# Patient Record
Sex: Male | Born: 1950 | Race: White | Hispanic: No | Marital: Married
Health system: Southern US, Community
[De-identification: ages and names within clinical notes are randomized; demographics above are authoritative.]

## PROBLEM LIST (undated history)

## (undated) DIAGNOSIS — A4102 Sepsis due to Methicillin resistant Staphylococcus aureus: Secondary | ICD-10-CM

## (undated) DIAGNOSIS — G894 Chronic pain syndrome: Secondary | ICD-10-CM

## (undated) DIAGNOSIS — J9621 Acute and chronic respiratory failure with hypoxia: Secondary | ICD-10-CM

## (undated) DIAGNOSIS — Z930 Tracheostomy status: Secondary | ICD-10-CM

## (undated) DIAGNOSIS — I739 Peripheral vascular disease, unspecified: Secondary | ICD-10-CM

## (undated) DIAGNOSIS — L97909 Non-pressure chronic ulcer of unspecified part of unspecified lower leg with unspecified severity: Secondary | ICD-10-CM

---

## 2019-06-22 ENCOUNTER — Inpatient Hospital Stay
Admission: RE | Admit: 2019-06-22 | Discharge: 2019-08-04 | Disposition: A | Discharge status: Skilled Nursing Facility | Payer: Medicare PPO | Source: Other Acute Inpatient Hospital | Attending: Internal Medicine | Admitting: Internal Medicine

## 2019-06-22 ENCOUNTER — Other Ambulatory Visit (HOSPITAL_COMMUNITY): Payer: Medicare PPO

## 2019-06-22 DIAGNOSIS — A4102 Sepsis due to Methicillin resistant Staphylococcus aureus: Secondary | ICD-10-CM | POA: Diagnosis present

## 2019-06-22 DIAGNOSIS — Z931 Gastrostomy status: Secondary | ICD-10-CM

## 2019-06-22 DIAGNOSIS — I739 Peripheral vascular disease, unspecified: Secondary | ICD-10-CM | POA: Diagnosis present

## 2019-06-22 DIAGNOSIS — G894 Chronic pain syndrome: Secondary | ICD-10-CM | POA: Diagnosis present

## 2019-06-22 DIAGNOSIS — J9621 Acute and chronic respiratory failure with hypoxia: Secondary | ICD-10-CM | POA: Diagnosis present

## 2019-06-22 DIAGNOSIS — J189 Pneumonia, unspecified organism: Secondary | ICD-10-CM

## 2019-06-22 DIAGNOSIS — Z930 Tracheostomy status: Secondary | ICD-10-CM

## 2019-06-22 HISTORY — DX: Chronic pain syndrome: G89.4

## 2019-06-22 HISTORY — DX: Tracheostomy status: Z93.0

## 2019-06-22 HISTORY — DX: Sepsis due to methicillin resistant Staphylococcus aureus: A41.02

## 2019-06-22 HISTORY — DX: Peripheral vascular disease, unspecified: L97.909

## 2019-06-22 HISTORY — DX: Non-pressure chronic ulcer of unspecified part of unspecified lower leg with unspecified severity: I73.9

## 2019-06-22 HISTORY — DX: Acute and chronic respiratory failure with hypoxia: J96.21

## 2019-06-22 MED ORDER — IOHEXOL 300 MG/ML  SOLN
20.0000 mL | Freq: Once | INTRAMUSCULAR | Status: AC | PRN
  Administered 2019-06-22: 20 mL

## 2019-06-23 DIAGNOSIS — A4102 Sepsis due to Methicillin resistant Staphylococcus aureus: Secondary | ICD-10-CM

## 2019-06-23 DIAGNOSIS — I739 Peripheral vascular disease, unspecified: Secondary | ICD-10-CM

## 2019-06-23 DIAGNOSIS — J9621 Acute and chronic respiratory failure with hypoxia: Secondary | ICD-10-CM

## 2019-06-23 DIAGNOSIS — G894 Chronic pain syndrome: Secondary | ICD-10-CM | POA: Diagnosis not present

## 2019-06-23 DIAGNOSIS — L97909 Non-pressure chronic ulcer of unspecified part of unspecified lower leg with unspecified severity: Secondary | ICD-10-CM

## 2019-06-23 DIAGNOSIS — Z930 Tracheostomy status: Secondary | ICD-10-CM

## 2019-06-23 LAB — BASIC METABOLIC PANEL
Anion gap: 13 (ref 5–15)
BUN: 60 mg/dL — ABNORMAL HIGH (ref 8–23)
CO2: 21 mmol/L — ABNORMAL LOW (ref 22–32)
Calcium: 9.9 mg/dL (ref 8.9–10.3)
Chloride: 107 mmol/L (ref 98–111)
Creatinine, Ser: 1.39 mg/dL — ABNORMAL HIGH (ref 0.61–1.24)
GFR calc Af Amer: 60 mL/min — ABNORMAL LOW (ref >60)
GFR calc non Af Amer: 51 mL/min — ABNORMAL LOW (ref >60)
Glucose, Bld: 165 mg/dL — ABNORMAL HIGH (ref 70–99)
Potassium: 4.4 mmol/L (ref 3.5–5.1)
Sodium: 141 mmol/L (ref 135–145)

## 2019-06-23 LAB — CBC
HCT: 35.5 % — ABNORMAL LOW (ref 39.0–52.0)
Hemoglobin: 11.3 g/dL — ABNORMAL LOW (ref 13.0–17.0)
MCH: 28.8 pg (ref 26.0–34.0)
MCHC: 31.8 g/dL (ref 30.0–36.0)
MCV: 90.6 fL (ref 80.0–100.0)
Platelets: 431 10*3/uL — ABNORMAL HIGH (ref 150–400)
RBC: 3.92 MIL/uL — ABNORMAL LOW (ref 4.22–5.81)
RDW: 16.2 % — ABNORMAL HIGH (ref 11.5–15.5)
WBC: 12.5 10*3/uL — ABNORMAL HIGH (ref 4.0–10.5)
nRBC: 0 % (ref 0.0–0.2)

## 2019-06-23 MED ORDER — GENERIC EXTERNAL MEDICATION
Status: DC
Start: ? — End: 2019-06-23

## 2019-06-23 MED ORDER — SODIUM CHLORIDE 0.9 % IV SOLN
10.00 | INTRAVENOUS | Status: DC
Start: ? — End: 2019-06-23

## 2019-06-23 MED ORDER — PHENOL 1.4 % MT LIQD
1.00 | OROMUCOSAL | Status: DC
Start: ? — End: 2019-06-23

## 2019-06-23 MED ORDER — DIPHENHYDRAMINE HCL 50 MG/ML IJ SOLN
12.50 | INTRAMUSCULAR | Status: DC
Start: ? — End: 2019-06-23

## 2019-06-23 MED ORDER — ZOLPIDEM TARTRATE 5 MG PO TABS
2.50 | ORAL_TABLET | ORAL | Status: DC
Start: ? — End: 2019-06-23

## 2019-06-23 MED ORDER — FIRST-LANSOPRAZOLE 3 MG/ML PO SUSP
30.00 | ORAL | Status: DC
Start: 2019-06-23 — End: 2019-06-23

## 2019-06-23 MED ORDER — LABETALOL HCL 5 MG/ML IV SOLN
20.00 | INTRAVENOUS | Status: DC
Start: ? — End: 2019-06-23

## 2019-06-23 MED ORDER — EZETIMIBE 10 MG PO TABS
10.00 | ORAL_TABLET | ORAL | Status: DC
Start: 2019-06-23 — End: 2019-06-23

## 2019-06-23 MED ORDER — OXYCODONE HCL 10 MG PO TABS
10.00 | ORAL_TABLET | ORAL | Status: DC
Start: 2019-06-23 — End: 2019-06-23

## 2019-06-23 MED ORDER — ENOXAPARIN SODIUM 40 MG/0.4ML ~~LOC~~ SOLN
40.00 | SUBCUTANEOUS | Status: DC
Start: 2019-06-23 — End: 2019-06-23

## 2019-06-23 MED ORDER — FENTANYL CITRATE (PF) 2500 MCG/50ML IJ SOLN
25.00 | INTRAMUSCULAR | Status: DC
Start: ? — End: 2019-06-23

## 2019-06-23 MED ORDER — ONDANSETRON HCL 4 MG/2ML IJ SOLN
4.00 | INTRAMUSCULAR | Status: DC
Start: ? — End: 2019-06-23

## 2019-06-23 MED ORDER — HYDROMORPHONE HCL 2 MG/ML IJ SOLN
2.00 | INTRAMUSCULAR | Status: DC
Start: ? — End: 2019-06-23

## 2019-06-23 MED ORDER — NITROGLYCERIN 0.4 MG SL SUBL
0.40 | SUBLINGUAL_TABLET | SUBLINGUAL | Status: DC
Start: ? — End: 2019-06-23

## 2019-06-23 NOTE — Consult Note (Signed)
Pulmonary Critical Care Medicine [facility] [place]  PULMONARY SERVICE  Date of Service: 06/23/2019  PULMONARY CRITICAL CARE Levi Fitzgerald  DGU:[id number]  DOB: 06/15/1951   DOA: 06/22/2019  Referring Physician: Carron Curie, MD  HPI: Levi Fitzgerald is a 68 y.o. male seen for follow up of Acute on Chronic Respiratory Failure.  Patient has multiple medical problems including hypertension hyperlipidemia peripheral vascular disease who had presented to the hospital because of some issues with rectal bleeding.  Patient was found to have a hemoglobin of 7.6 and received approximately 13 units of packed red cells back in September.  Patient had been intubated at that time.  Had difficulty weaning off the ventilator and eventually ended up failing extubation trials.  Patient subsequently had a tracheostomy done.  Also had endoscopy and CT angiogram.  Patient had infected access graft glucose bypass patient had some issues with infection and acute renal failure which did not require dialysis at that time.  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  Review of Systems:  ROS performed and is unremarkable other than noted above.   Past Medical History:  Diagnosis Date  . Hyperlipidemia  . Hypertension  . MRSA (methicillin resistant staph aureus) culture positive  . PVD (peripheral vascular disease) (*)   Past Surgical History:  Procedure Laterality Date  . 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 Gillian Scarce  . Carotid endarterectomy Left  [facility]  . Left axillary to bifemoral bypass 2019  Dr. Gillian Scarce  . Muscle flap Left  Dr Gillian Scarce, (?sartorious)   Allergies  Allergen Reactions  . Penicillins Hives  Tolerated zosyn Sept 2020     Social History   Socioeconomic History  . Marital status: Married  Spouse name: Not on file  . Number of children: Not on file  . Years of education: Not on file  . Highest education level: Not  on file  Occupational History  . Not on file  Social Needs  . Financial resource strain: Not on file  . Food insecurity  Worry: Not on file  Inability: Not on file  . Transportation needs  Medical: Not on file  Non-medical: Not on file  Tobacco Use  . Smoking status: Former Smoker  Quit date: 05/09/2016  Years since quitting: 3.0  . Smokeless tobacco: Former Engineer, water and Sexual Activity  . Alcohol use: Not on file  . Drug use: Never    Family history Noncontributory  Medications: Reviewed on Rounds  Physical Exam:  Vitals: Temperature 98.2 pulse 119 respiratory 29 blood pressure 137/74  Ventilator Settings off the ventilator right now on T collar FiO2 is 28%  . General: Comfortable at this time . Eyes: Grossly normal lids, irises & conjunctiva . ENT: grossly tongue is normal . Neck: no obvious mass . Cardiovascular: S1-S2 normal no gallop or rub is noted . Respiratory: No rhonchi no rales are noted at this time . Abdomen: Soft and nontender . Skin: no rash seen on limited exam . Musculoskeletal: not rigid . Psychiatric:unable to assess . Neurologic: no seizure no involuntary movements         Labs on Admission:  Basic Metabolic Panel: No results for input(s): NA, K, CL, CO2, GLUCOSE, BUN, CREATININE, CALCIUM, MG, PHOS in the last 168 hours.  No results for input(s): PHART, PCO2ART, PO2ART, HCO3, O2SAT in the last 168 hours.  Liver Function Tests: No results for input(s): AST, ALT, ALKPHOS, BILITOT, PROT, ALBUMIN in  the last 168 hours. No results for input(s): LIPASE, AMYLASE in the last 168 hours. No results for input(s): AMMONIA in the last 168 hours.  CBC: No results for input(s): WBC, NEUTROABS, HGB, HCT, MCV, PLT in the last 168 hours.  Cardiac Enzymes: No results for input(s): CKTOTAL, CKMB, CKMBINDEX, TROPONINI in the last 168 hours.  BNP (last 3 results) No results for input(s): BNP in the last 8760 hours.  ProBNP (last 3 results) No  results for input(s): PROBNP in the last 8760 hours.   Radiological Exams on Admission: Dg Abdomen Peg Tube Location  Result Date: 06/22/2019 CLINICAL DATA:  PEG placement. EXAM: ABDOMEN - 1 VIEW COMPARISON:  None. FINDINGS: AP portable supine view of the abdomen obtained after the installation of 20 cc Omnipaque 300 through indwelling gastrostomy tube. Contrast opacifies the gastrostomy tubing, stomach, duodenum and proximal jejunum. No evidence of extravasation or leak. No bowel obstruction. Elevated right hemidiaphragm. IMPRESSION: Gastrostomy tube in the stomach without evidence of extravasation or leak. Electronically Signed   By: Keith Rake M.D.   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 Acute on chronic respiratory failure with hypoxia patient currently is on T collar has been on 28% FiO2 the plan is to assess for the possibility of starting on PMV trials.  Secretions are reportedly moderate needs ongoing aggressive pulmonary toilet.  Also need follow-up on chest films. 2. Peripheral vascular disease severe patient has been on chronic anticoagulation aspirin and Eliquis. 3. MRSA sepsis and infection patient been treated follow-up for history of recurrence. 4. Chronic pain syndrome controlled at this time we will continue with supportive care 5. Tracheostomy patient has a tracheostomy done apparently back on September 24 now is on T collar  I have personally seen and evaluated the patient, evaluated laboratory and imaging results, formulated the assessment and plan and placed orders. The Patient requires high complexity decision making for assessment and support.  Case was discussed on Rounds with the  Respiratory Therapy Staff Time Spent 43minutes  Allyne Gee, MD [facility] Pulmonary Critical Care Medicine Sleep Medicine

## 2019-06-24 ENCOUNTER — Encounter: Payer: Self-pay | Admitting: Internal Medicine

## 2019-06-24 DIAGNOSIS — J9621 Acute and chronic respiratory failure with hypoxia: Secondary | ICD-10-CM | POA: Diagnosis present

## 2019-06-24 DIAGNOSIS — L97909 Non-pressure chronic ulcer of unspecified part of unspecified lower leg with unspecified severity: Secondary | ICD-10-CM | POA: Diagnosis present

## 2019-06-24 DIAGNOSIS — Z930 Tracheostomy status: Secondary | ICD-10-CM

## 2019-06-24 DIAGNOSIS — G894 Chronic pain syndrome: Secondary | ICD-10-CM | POA: Diagnosis present

## 2019-06-24 DIAGNOSIS — A4102 Sepsis due to Methicillin resistant Staphylococcus aureus: Secondary | ICD-10-CM | POA: Diagnosis not present

## 2019-06-24 DIAGNOSIS — I739 Peripheral vascular disease, unspecified: Secondary | ICD-10-CM | POA: Diagnosis not present

## 2019-06-24 NOTE — Progress Notes (Signed)
Pulmonary Critical Care Medicine [place]   PULMONARY CRITICAL CARE SERVICE  PROGRESS NOTE  Date of Service: 06/24/2019  Kortland Nichols  RSW:[id number]  DOB: 11/23/1950   DOA: 06/22/2019  Referring Physician: Merton Border, MD  HPI: Bowden Boody is a 68 y.o. male seen for follow up of Acute on Chronic Respiratory Failure.  Patient is on T collar is actually doing fairly well right now is on 28% FiO2  Medications: Reviewed on Rounds  Physical Exam:  Vitals: Temperature 97.9 pulse 104 respiratory 24 blood pressure 122/55 saturations 99%  Ventilator Settings off the ventilator currently on T collar  . General: Comfortable at this time . Eyes: Grossly normal lids, irises & conjunctiva . ENT: grossly tongue is normal . Neck: no obvious mass . Cardiovascular: S1 S2 normal no gallop . Respiratory: No rhonchi no rales are noted at this time . Abdomen: soft . Skin: no rash seen on limited exam . Musculoskeletal: not rigid . Psychiatric:unable to assess . Neurologic: no seizure no involuntary movements         Lab Data:   Basic Metabolic Panel: Recent Labs  Lab 06/23/19 1703  NA 141  K 4.4  CL 107  CO2 21*  GLUCOSE 165*  BUN 60*  CREATININE 1.39*  CALCIUM 9.9    ABG: No results for input(s): PHART, PCO2ART, PO2ART, HCO3, O2SAT in the last 168 hours.  Liver Function Tests: No results for input(s): AST, ALT, ALKPHOS, BILITOT, PROT, ALBUMIN in the last 168 hours. No results for input(s): LIPASE, AMYLASE in the last 168 hours. No results for input(s): AMMONIA in the last 168 hours.  CBC: Recent Labs  Lab 06/23/19 1703  WBC 12.5*  HGB 11.3*  HCT 35.5*  MCV 90.6  PLT 431*    Cardiac Enzymes: No results for input(s): CKTOTAL, CKMB, CKMBINDEX, TROPONINI in the last 168 hours.  BNP (last 3 results) No results for input(s): BNP in the last 8760 hours.  ProBNP (last 3 results) No results for input(s): PROBNP in the last 8760  hours.  Radiological Exams: Dg Abdomen Peg Tube Location  Result Date: 06/22/2019 CLINICAL DATA:  PEG placement. EXAM: ABDOMEN - 1 VIEW COMPARISON:  None. FINDINGS: AP portable supine view of the abdomen obtained after the installation of 20 cc Omnipaque 300 through indwelling gastrostomy tube. Contrast opacifies the gastrostomy tubing, stomach, duodenum and proximal jejunum. No evidence of extravasation or leak. No bowel obstruction. Elevated right hemidiaphragm. IMPRESSION: Gastrostomy tube in the stomach without evidence of extravasation or leak. Electronically Signed   By: Keith Rake M.D.   On: 06/22/2019 23:53    Assessment/Plan Active Problems:   Acute on chronic respiratory failure with hypoxia (HCC)   Peripheral vascular disease of lower extremity with ulceration (HCC)   Sepsis due to methicillin resistant Staphylococcus aureus (MRSA) (HCC)   Chronic pain syndrome   Tracheostomy status ([place])   1. Acute on chronic respiratory failure with hypoxia continue to advance the wean as tolerated patient is doing well so far 2. Peripheral vascular disease treated status post amputation 3. Sepsis treated we will continue to follow for recurrence of fevers 4. Chronic pain syndrome controlled 5. Tracheostomy working towards capping eventually   I have personally seen and evaluated the patient, evaluated laboratory and imaging results, formulated the assessment and plan and placed orders. The Patient requires high complexity decision making for assessment and support.  Case was discussed on Rounds with the Respiratory Therapy Staff  Allyne Gee, MD [facility] Pulmonary  Critical Care Medicine Sleep Medicine

## 2019-06-25 DIAGNOSIS — A4102 Sepsis due to Methicillin resistant Staphylococcus aureus: Secondary | ICD-10-CM | POA: Diagnosis not present

## 2019-06-25 DIAGNOSIS — G894 Chronic pain syndrome: Secondary | ICD-10-CM | POA: Diagnosis not present

## 2019-06-25 DIAGNOSIS — J9621 Acute and chronic respiratory failure with hypoxia: Secondary | ICD-10-CM | POA: Diagnosis not present

## 2019-06-25 DIAGNOSIS — I739 Peripheral vascular disease, unspecified: Secondary | ICD-10-CM | POA: Diagnosis not present

## 2019-06-25 NOTE — Progress Notes (Signed)
Pulmonary [place]   PULMONARY CRITICAL CARE SERVICE  PROGRESS NOTE  Date of Service: 06/25/2019  Levi Fitzgerald  YTK:[id number]  DOB: 09-07-1950   DOA: 06/22/2019  Referring Physician: Merton Border, MD  HPI: Levi Fitzgerald is a 68 y.o. male seen for follow up of Acute on Chronic Respiratory Failure.  Patient currently is on T collar has been on 28% FiO2 using PMV  Medications: Reviewed on Rounds  Physical Exam:  Vitals: Temperature 99.0 pulse 94 respiratory 23 blood pressure 98/51 saturations 95%  Ventilator Settings off the ventilator right now on T collar 28% FiO2 with PMV  . General: Comfortable at this time . Eyes: Grossly normal lids, irises & conjunctiva . ENT: grossly tongue is normal . Neck: no obvious mass . Cardiovascular: S1 S2 normal no gallop . Respiratory: No rhonchi no rales are noted at this time . Abdomen: soft . Skin: no rash seen on limited exam . Musculoskeletal: not rigid . Psychiatric:unable to assess . Neurologic: no seizure no involuntary movements         Lab Data:   Basic Metabolic Panel: Recent Labs  Lab 06/23/19 1703  NA 141  K 4.4  CL 107  CO2 21*  GLUCOSE 165*  BUN 60*  CREATININE 1.39*  CALCIUM 9.9    ABG: No results for input(s): PHART, PCO2ART, PO2ART, HCO3, O2SAT in the last 168 hours.  Liver Function Tests: No results for input(s): AST, ALT, ALKPHOS, BILITOT, PROT, ALBUMIN in the last 168 hours. No results for input(s): LIPASE, AMYLASE in the last 168 hours. No results for input(s): AMMONIA in the last 168 hours.  CBC: Recent Labs  Lab 06/23/19 1703  WBC 12.5*  HGB 11.3*  HCT 35.5*  MCV 90.6  PLT 431*    Cardiac Enzymes: No results for input(s): CKTOTAL, CKMB, CKMBINDEX, TROPONINI in the last 168 hours.  BNP (last 3 results) No results for input(s): BNP in the last 8760 hours.  ProBNP (last 3 results) No results for input(s): PROBNP in the last 8760  hours.  Radiological Exams: No results found.  Assessment/Plan Active Problems:   Acute on chronic respiratory failure with hypoxia (HCC)   Peripheral vascular disease of lower extremity with ulceration (HCC)   Sepsis due to methicillin resistant Staphylococcus aureus (MRSA) (HCC)   Chronic pain syndrome   Tracheostomy status ([place])   1. Acute on chronic respiratory failure with hypoxia plan continue with T collar on 28% FiO2 continue with the PMV 2. Peripheral vascular disease supportive care unchanged 3. Severe sepsis due to MRSA treated 4. Chronic pain syndrome treated we will continue to follow 5. Tracheostomy remains in place at this time   I have personally seen and evaluated the patient, evaluated laboratory and imaging results, formulated the assessment and plan and placed orders. The Patient requires high complexity decision making for assessment and support.  Case was discussed on Rounds with the Respiratory Therapy Staff  Allyne Gee, MD [facility] Pulmonary Critical Care Medicine Sleep Medicine

## 2019-06-26 DIAGNOSIS — G894 Chronic pain syndrome: Secondary | ICD-10-CM | POA: Diagnosis not present

## 2019-06-26 DIAGNOSIS — I739 Peripheral vascular disease, unspecified: Secondary | ICD-10-CM | POA: Diagnosis not present

## 2019-06-26 DIAGNOSIS — J9621 Acute and chronic respiratory failure with hypoxia: Secondary | ICD-10-CM | POA: Diagnosis not present

## 2019-06-26 DIAGNOSIS — A4102 Sepsis due to Methicillin resistant Staphylococcus aureus: Secondary | ICD-10-CM | POA: Diagnosis not present

## 2019-06-26 NOTE — Progress Notes (Signed)
Pulmonary [place]   PULMONARY CRITICAL CARE SERVICE  PROGRESS NOTE  Date of Service: 06/26/2019  Levi Fitzgerald  CNO:[id number]  DOB: Dec 27, 1950   DOA: 06/22/2019  Referring Physician: Merton Border, MD  HPI: Levi Fitzgerald is a 68 y.o. male seen for follow up of Acute on Chronic Respiratory Failure.  Currently patient is capping his been on room air today will be completing 24 hours on capping trials  Medications: Reviewed on Rounds  Physical Exam:  Vitals: Temperature 98.8 pulse 102 respiratory 24 blood pressure 133/58 saturations 93%  Ventilator Settings capping right now on room air  . General: Comfortable at this time . Eyes: Grossly normal lids, irises & conjunctiva . ENT: grossly tongue is normal . Neck: no obvious mass . Cardiovascular: S1 S2 normal no gallop . Respiratory: No rhonchi coarse breath sounds are noted . Abdomen: soft . Skin: no rash seen on limited exam . Musculoskeletal: not rigid . Psychiatric:unable to assess . Neurologic: no seizure no involuntary movements         Lab Data:   Basic Metabolic Panel: Recent Labs  Lab 06/23/19 1703  NA 141  K 4.4  CL 107  CO2 21*  GLUCOSE 165*  BUN 60*  CREATININE 1.39*  CALCIUM 9.9    ABG: No results for input(s): PHART, PCO2ART, PO2ART, HCO3, O2SAT in the last 168 hours.  Liver Function Tests: No results for input(s): AST, ALT, ALKPHOS, BILITOT, PROT, ALBUMIN in the last 168 hours. No results for input(s): LIPASE, AMYLASE in the last 168 hours. No results for input(s): AMMONIA in the last 168 hours.  CBC: Recent Labs  Lab 06/23/19 1703  WBC 12.5*  HGB 11.3*  HCT 35.5*  MCV 90.6  PLT 431*    Cardiac Enzymes: No results for input(s): CKTOTAL, CKMB, CKMBINDEX, TROPONINI in the last 168 hours.  BNP (last 3 results) No results for input(s): BNP in the last 8760 hours.  ProBNP (last 3 results) No results for input(s): PROBNP in the last 8760  hours.  Radiological Exams: No results found.  Assessment/Plan Active Problems:   Acute on chronic respiratory failure with hypoxia (HCC)   Peripheral vascular disease of lower extremity with ulceration (HCC)   Sepsis due to methicillin resistant Staphylococcus aureus (MRSA) (HCC)   Chronic pain syndrome   Tracheostomy status ([place])   1. Acute on chronic respiratory failure with hypoxia progressing very nicely with the wean as already mentioned above patient is capping on exam room air. 2. Peripheral vascular disease at baseline we will continue with supportive care 3. Sepsis treated hemodynamics are stable 4. Chronic pain syndrome treated we will continue with supportive care 5. Tracheostomy doing well with the   I have personally seen and evaluated the patient, evaluated laboratory and imaging results, formulated the assessment and plan and placed orders. The Patient requires high complexity decision making for assessment and support.  Case was discussed on Rounds with the Respiratory Therapy Staff  Allyne Gee, MD [facility] Pulmonary Critical Care Medicine Sleep Medicine

## 2019-06-27 DIAGNOSIS — G894 Chronic pain syndrome: Secondary | ICD-10-CM | POA: Diagnosis not present

## 2019-06-27 DIAGNOSIS — I739 Peripheral vascular disease, unspecified: Secondary | ICD-10-CM | POA: Diagnosis not present

## 2019-06-27 DIAGNOSIS — J9621 Acute and chronic respiratory failure with hypoxia: Secondary | ICD-10-CM | POA: Diagnosis not present

## 2019-06-27 DIAGNOSIS — A4102 Sepsis due to Methicillin resistant Staphylococcus aureus: Secondary | ICD-10-CM | POA: Diagnosis not present

## 2019-06-27 NOTE — Progress Notes (Signed)
Pulmonary [place]   PULMONARY CRITICAL CARE SERVICE  PROGRESS NOTE  Date of Service: 06/27/2019  Jerri Hargadon  YOV:[id number]  DOB: [birth year]   DOA: 06/22/2019  Referring Physician: Merton Border, MD  HPI: Levi Fitzgerald is a 68 y.o. male seen for follow up of Acute on Chronic Respiratory Failure.  Patient is capping doing well secretions are minimal will be completing 48 hours today  Medications: Reviewed on Rounds  Physical Exam:  Vitals: Temperature 97.3 pulse 65 respiratory 18 blood pressure 118/62 saturations 95%  Ventilator Settings capping off the ventilator right now  . General: Comfortable at this time . Eyes: Grossly normal lids, irises & conjunctiva . ENT: grossly tongue is normal . Neck: no obvious mass . Cardiovascular: S1 S2 normal no gallop . Respiratory: No rhonchi coarse breath sounds . Abdomen: soft . Skin: no rash seen on limited exam . Musculoskeletal: not rigid . Psychiatric:unable to assess . Neurologic: no seizure no involuntary movements         Lab Data:   Basic Metabolic Panel: Recent Labs  Lab 06/23/19 1703  NA 141  K 4.4  CL 107  CO2 21*  GLUCOSE 165*  BUN 60*  CREATININE 1.39*  CALCIUM 9.9    ABG: No results for input(s): PHART, PCO2ART, PO2ART, HCO3, O2SAT in the last 168 hours.  Liver Function Tests: No results for input(s): AST, ALT, ALKPHOS, BILITOT, PROT, ALBUMIN in the last 168 hours. No results for input(s): LIPASE, AMYLASE in the last 168 hours. No results for input(s): AMMONIA in the last 168 hours.  CBC: Recent Labs  Lab 06/23/19 1703  WBC 12.5*  HGB 11.3*  HCT 35.5*  MCV 90.6  PLT 431*    Cardiac Enzymes: No results for input(s): CKTOTAL, CKMB, CKMBINDEX, TROPONINI in the last 168 hours.  BNP (last 3 results) No results for input(s): BNP in the last 8760 hours.  ProBNP (last 3 results) No results for input(s): PROBNP in the last 8760 hours.  Radiological  Exams: No results found.  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 Acute on chronic respiratory failure with hypoxia we will continue with capping trials tomorrow should be able to decannulate 2. Peripheral vascular disease at baseline we will continue with present management 3. Severe sepsis hemodynamics are stable 4. Chronic pain syndrome we will continue with supportive care 5. Tracheostomy should be able to decannulate tomorrow   I have personally seen and evaluated the patient, evaluated laboratory and imaging results, formulated the assessment and plan and placed orders. The Patient requires high complexity decision making for assessment and support.  Case was discussed on Rounds with the Respiratory Therapy Staff  Allyne Gee, MD [facility] Pulmonary Critical Care Medicine Sleep Medicine

## 2019-06-28 ENCOUNTER — Other Ambulatory Visit (HOSPITAL_COMMUNITY): Payer: Medicare PPO

## 2019-06-28 DIAGNOSIS — J9621 Acute and chronic respiratory failure with hypoxia: Secondary | ICD-10-CM | POA: Diagnosis not present

## 2019-06-28 DIAGNOSIS — A4102 Sepsis due to Methicillin resistant Staphylococcus aureus: Secondary | ICD-10-CM | POA: Diagnosis not present

## 2019-06-28 DIAGNOSIS — G894 Chronic pain syndrome: Secondary | ICD-10-CM | POA: Diagnosis not present

## 2019-06-28 DIAGNOSIS — I739 Peripheral vascular disease, unspecified: Secondary | ICD-10-CM | POA: Diagnosis not present

## 2019-06-28 MED ORDER — GENERIC EXTERNAL MEDICATION
Status: DC
Start: ? — End: 2019-06-28

## 2019-06-28 NOTE — Progress Notes (Signed)
Pulmonary [place]   PULMONARY CRITICAL CARE SERVICE  PROGRESS NOTE  Date of Service: 06/28/2019  Levi Fitzgerald  LOV:[id number]  DOB: 08-09-1951   DOA: 06/22/2019  Referring Physician: Merton Border, MD  HPI: Levi Fitzgerald is a 68 y.o. male seen for follow up of Acute on Chronic Respiratory Failure.  Patient looks great has been capping ready for decannulation  Medications: Reviewed on Rounds  Physical Exam:  Vitals: Temperature 98.0 pulse 101 respiratory rate 17 blood pressure 113/58 saturations 95%  Ventilator Settings capping off the ventilator  . General: Comfortable at this time . Eyes: Grossly normal lids, irises & conjunctiva . ENT: grossly tongue is normal . Neck: no obvious mass . Cardiovascular: S1 S2 normal no gallop . Respiratory: No rhonchi no rales are noted at this time . Abdomen: soft . Skin: no rash seen on limited exam . Musculoskeletal: not rigid . Psychiatric:unable to assess . Neurologic: no seizure no involuntary movements         Lab Data:   Basic Metabolic Panel: Recent Labs  Lab 06/23/19 1703  NA 141  K 4.4  CL 107  CO2 21*  GLUCOSE 165*  BUN 60*  CREATININE 1.39*  CALCIUM 9.9    ABG: No results for input(s): PHART, PCO2ART, PO2ART, HCO3, O2SAT in the last 168 hours.  Liver Function Tests: No results for input(s): AST, ALT, ALKPHOS, BILITOT, PROT, ALBUMIN in the last 168 hours. No results for input(s): LIPASE, AMYLASE in the last 168 hours. No results for input(s): AMMONIA in the last 168 hours.  CBC: Recent Labs  Lab 06/23/19 1703  WBC 12.5*  HGB 11.3*  HCT 35.5*  MCV 90.6  PLT 431*    Cardiac Enzymes: No results for input(s): CKTOTAL, CKMB, CKMBINDEX, TROPONINI in the last 168 hours.  BNP (last 3 results) No results for input(s): BNP in the last 8760 hours.  ProBNP (last 3 results) No results for input(s): PROBNP in the last 8760 hours.  Radiological Exams: No results  found.  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 Acute on chronic respiratory failure with hypoxia patient is ready for decannulation 2. Peripheral vascular disease at baseline continue supportive care 3. Sepsis due to MRSA treated we will continue to follow 4. Chronic pain syndrome controlled 5. Tracheostomy remains in place   I have personally seen and evaluated the patient, evaluated laboratory and imaging results, formulated the assessment and plan and placed orders. The Patient requires high complexity decision making for assessment and support.  Case was discussed on Rounds with the Respiratory Therapy Staff  Allyne Gee, MD [facility] Pulmonary Critical Care Medicine Sleep Medicine

## 2019-06-29 DIAGNOSIS — I739 Peripheral vascular disease, unspecified: Secondary | ICD-10-CM | POA: Diagnosis not present

## 2019-06-29 DIAGNOSIS — J9621 Acute and chronic respiratory failure with hypoxia: Secondary | ICD-10-CM | POA: Diagnosis not present

## 2019-06-29 DIAGNOSIS — G894 Chronic pain syndrome: Secondary | ICD-10-CM | POA: Diagnosis not present

## 2019-06-29 DIAGNOSIS — A4102 Sepsis due to Methicillin resistant Staphylococcus aureus: Secondary | ICD-10-CM | POA: Diagnosis not present

## 2019-06-29 LAB — BASIC METABOLIC PANEL
Anion gap: 11 (ref 5–15)
BUN: 97 mg/dL — ABNORMAL HIGH (ref 8–23)
CO2: 24 mmol/L (ref 22–32)
Calcium: 11.1 mg/dL — ABNORMAL HIGH (ref 8.9–10.3)
Chloride: 112 mmol/L — ABNORMAL HIGH (ref 98–111)
Creatinine, Ser: 2.31 mg/dL — ABNORMAL HIGH (ref 0.61–1.24)
GFR calc Af Amer: 32 mL/min — ABNORMAL LOW (ref >60)
GFR calc non Af Amer: 28 mL/min — ABNORMAL LOW (ref >60)
Glucose, Bld: 114 mg/dL — ABNORMAL HIGH (ref 70–99)
Potassium: 4.9 mmol/L (ref 3.5–5.1)
Sodium: 147 mmol/L — ABNORMAL HIGH (ref 135–145)

## 2019-06-29 LAB — CBC
HCT: 38.9 % — ABNORMAL LOW (ref 39.0–52.0)
Hemoglobin: 11.5 g/dL — ABNORMAL LOW (ref 13.0–17.0)
MCH: 28 pg (ref 26.0–34.0)
MCHC: 29.6 g/dL — ABNORMAL LOW (ref 30.0–36.0)
MCV: 94.9 fL (ref 80.0–100.0)
Platelets: 431 10*3/uL — ABNORMAL HIGH (ref 150–400)
RBC: 4.1 MIL/uL — ABNORMAL LOW (ref 4.22–5.81)
RDW: 16.3 % — ABNORMAL HIGH (ref 11.5–15.5)
WBC: 12.2 10*3/uL — ABNORMAL HIGH (ref 4.0–10.5)
nRBC: 0 % (ref 0.0–0.2)

## 2019-06-29 NOTE — Progress Notes (Signed)
[facility]   PULMONARY CRITICAL CARE SERVICE  PROGRESS NOTE  Date of Service: 06/29/2019  Levi Fitzgerald  GXQ:[id number]  DOB: 1951/03/27   DOA: 06/22/2019  Referring Physician: Merton Border, MD  HPI: Levi Fitzgerald is a 68 y.o. male seen for follow up of Acute on Chronic Respiratory Failure.  Patient is decannulated doing well right now is on room air  Medications: Reviewed on Rounds  Physical Exam:  Vitals: Temperature 97.5 pulse 70 respiratory 21 blood pressure 116/55 saturations 95%  Ventilator Settings decannulated  . General: Comfortable at this time . Eyes: Grossly normal lids, irises & conjunctiva . ENT: grossly tongue is normal . Neck: no obvious mass . Cardiovascular: S1 S2 normal no gallop . Respiratory: No rhonchi no rales . Abdomen: soft . Skin: no rash seen on limited exam . Musculoskeletal: not rigid . Psychiatric:unable to assess . Neurologic: no seizure no involuntary movements         Lab Data:   Basic Metabolic Panel: Recent Labs  Lab 06/23/19 1703 06/29/19 0733  NA 141 147*  K 4.4 4.9  CL 107 112*  CO2 21* 24  GLUCOSE 165* 114*  BUN 60* 97*  CREATININE 1.39* 2.31*  CALCIUM 9.9 11.1*    ABG: No results for input(s): PHART, PCO2ART, PO2ART, HCO3, O2SAT in the last 168 hours.  Liver Function Tests: No results for input(s): AST, ALT, ALKPHOS, BILITOT, PROT, ALBUMIN in the last 168 hours. No results for input(s): LIPASE, AMYLASE in the last 168 hours. No results for input(s): AMMONIA in the last 168 hours.  CBC: Recent Labs  Lab 06/23/19 1703 06/29/19 0733  WBC 12.5* 12.2*  HGB 11.3* 11.5*  HCT 35.5* 38.9*  MCV 90.6 94.9  PLT 431* 431*    Cardiac Enzymes: No results for input(s): CKTOTAL, CKMB, CKMBINDEX, TROPONINI in the last 168 hours.  BNP (last 3 results) No results for input(s): BNP in the last 8760 hours.  ProBNP (last 3 results) No results for input(s): PROBNP in the last  8760 hours.  Radiological Exams: No results found.  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 Acute on chronic respiratory failure with hypoxia patient has been doing fine with decannulation we will continue with supportive care 2. Peripheral vascular disease no changes 3. Sepsis resolved 4. Chronic pain syndrome controlled 5. Tracheostomy we will continue present management   I have personally seen and evaluated the patient, evaluated laboratory and imaging results, formulated the assessment and plan and placed orders. The Patient requires high complexity decision making for assessment and support.  Case was discussed on Rounds with the Respiratory Therapy Staff  Allyne Gee, MD [facility] Pulmonary Critical Care Medicine Sleep Medicine

## 2019-07-01 LAB — URINALYSIS, MICROSCOPIC (REFLEX)

## 2019-07-01 LAB — BASIC METABOLIC PANEL
Anion gap: 14 (ref 5–15)
BUN: 90 mg/dL — ABNORMAL HIGH (ref 8–23)
CO2: 23 mmol/L (ref 22–32)
Calcium: 10.8 mg/dL — ABNORMAL HIGH (ref 8.9–10.3)
Chloride: 107 mmol/L (ref 98–111)
Creatinine, Ser: 2.18 mg/dL — ABNORMAL HIGH (ref 0.61–1.24)
GFR calc Af Amer: 34 mL/min — ABNORMAL LOW (ref >60)
GFR calc non Af Amer: 30 mL/min — ABNORMAL LOW (ref >60)
Glucose, Bld: 127 mg/dL — ABNORMAL HIGH (ref 70–99)
Potassium: 4.6 mmol/L (ref 3.5–5.1)
Sodium: 144 mmol/L (ref 135–145)

## 2019-07-01 LAB — BLOOD GAS, ARTERIAL
Acid-Base Excess: 1.4 mmol/L (ref 0.0–2.0)
Bicarbonate: 24.9 mmol/L (ref 20.0–28.0)
FIO2: 21
O2 Saturation: 94.1 %
Patient temperature: 37.5
pCO2 arterial: 36.8 mmHg (ref 32.0–48.0)
pH, Arterial: 7.448 (ref 7.350–7.450)
pO2, Arterial: 71.5 mmHg — ABNORMAL LOW (ref 83.0–108.0)

## 2019-07-01 LAB — URINALYSIS, ROUTINE W REFLEX MICROSCOPIC
Bilirubin Urine: NEGATIVE
Glucose, UA: NEGATIVE mg/dL
Ketones, ur: NEGATIVE mg/dL
Nitrite: NEGATIVE
Protein, ur: NEGATIVE mg/dL
Specific Gravity, Urine: 1.01 (ref 1.005–1.030)
pH: 6.5 (ref 5.0–8.0)

## 2019-07-02 LAB — URINE CULTURE: Culture: NO GROWTH

## 2019-07-03 ENCOUNTER — Other Ambulatory Visit (HOSPITAL_COMMUNITY): Payer: Medicare PPO

## 2019-07-03 LAB — COMPREHENSIVE METABOLIC PANEL
ALT: 39 U/L (ref 0–44)
AST: 33 U/L (ref 15–41)
Albumin: 2.5 g/dL — ABNORMAL LOW (ref 3.5–5.0)
Alkaline Phosphatase: 143 U/L — ABNORMAL HIGH (ref 38–126)
Anion gap: 12 (ref 5–15)
BUN: 81 mg/dL — ABNORMAL HIGH (ref 8–23)
CO2: 24 mmol/L (ref 22–32)
Calcium: 11.1 mg/dL — ABNORMAL HIGH (ref 8.9–10.3)
Chloride: 109 mmol/L (ref 98–111)
Creatinine, Ser: 2.52 mg/dL — ABNORMAL HIGH (ref 0.61–1.24)
GFR calc Af Amer: 29 mL/min — ABNORMAL LOW (ref >60)
GFR calc non Af Amer: 25 mL/min — ABNORMAL LOW (ref >60)
Glucose, Bld: 110 mg/dL — ABNORMAL HIGH (ref 70–99)
Potassium: 4.7 mmol/L (ref 3.5–5.1)
Sodium: 145 mmol/L (ref 135–145)
Total Bilirubin: 1 mg/dL (ref 0.3–1.2)
Total Protein: 7 g/dL (ref 6.5–8.1)

## 2019-07-03 LAB — CBC
HCT: 37 % — ABNORMAL LOW (ref 39.0–52.0)
Hemoglobin: 11 g/dL — ABNORMAL LOW (ref 13.0–17.0)
MCH: 28.1 pg (ref 26.0–34.0)
MCHC: 29.7 g/dL — ABNORMAL LOW (ref 30.0–36.0)
MCV: 94.6 fL (ref 80.0–100.0)
Platelets: 342 10*3/uL (ref 150–400)
RBC: 3.91 MIL/uL — ABNORMAL LOW (ref 4.22–5.81)
RDW: 15.9 % — ABNORMAL HIGH (ref 11.5–15.5)
WBC: 11.8 10*3/uL — ABNORMAL HIGH (ref 4.0–10.5)
nRBC: 0 % (ref 0.0–0.2)

## 2019-07-03 MED ORDER — GENERIC EXTERNAL MEDICATION
Status: DC
Start: ? — End: 2019-07-03

## 2019-07-06 LAB — CULTURE, BLOOD (ROUTINE X 2)
Culture: NO GROWTH
Culture: NO GROWTH
Special Requests: ADEQUATE

## 2019-07-08 LAB — CBC
HCT: 30.9 % — ABNORMAL LOW (ref 39.0–52.0)
Hemoglobin: 9.4 g/dL — ABNORMAL LOW (ref 13.0–17.0)
MCH: 28.1 pg (ref 26.0–34.0)
MCHC: 30.4 g/dL (ref 30.0–36.0)
MCV: 92.2 fL (ref 80.0–100.0)
Platelets: 230 10*3/uL (ref 150–400)
RBC: 3.35 MIL/uL — ABNORMAL LOW (ref 4.22–5.81)
RDW: 15.4 % (ref 11.5–15.5)
WBC: 9.8 10*3/uL (ref 4.0–10.5)
nRBC: 0 % (ref 0.0–0.2)

## 2019-07-08 LAB — BASIC METABOLIC PANEL
Anion gap: 14 (ref 5–15)
BUN: 33 mg/dL — ABNORMAL HIGH (ref 8–23)
CO2: 23 mmol/L (ref 22–32)
Calcium: 9 mg/dL (ref 8.9–10.3)
Chloride: 102 mmol/L (ref 98–111)
Creatinine, Ser: 1.74 mg/dL — ABNORMAL HIGH (ref 0.61–1.24)
GFR calc Af Amer: 45 mL/min — ABNORMAL LOW (ref >60)
GFR calc non Af Amer: 39 mL/min — ABNORMAL LOW (ref >60)
Glucose, Bld: 110 mg/dL — ABNORMAL HIGH (ref 70–99)
Potassium: 3.2 mmol/L — ABNORMAL LOW (ref 3.5–5.1)
Sodium: 139 mmol/L (ref 135–145)

## 2019-07-09 ENCOUNTER — Other Ambulatory Visit (HOSPITAL_COMMUNITY): Payer: Medicare PPO

## 2019-07-09 LAB — POTASSIUM: Potassium: 3.8 mmol/L (ref 3.5–5.1)

## 2019-07-09 MED ORDER — IOHEXOL 300 MG/ML  SOLN
80.0000 mL | Freq: Once | INTRAMUSCULAR | Status: AC | PRN
  Administered 2019-07-09: 12:00:00 80 mL via INTRAVENOUS

## 2019-07-09 MED ORDER — GENERIC EXTERNAL MEDICATION
Status: DC
Start: ? — End: 2019-07-09

## 2019-07-10 LAB — BASIC METABOLIC PANEL
Anion gap: 11 (ref 5–15)
BUN: 22 mg/dL (ref 8–23)
CO2: 23 mmol/L (ref 22–32)
Calcium: 9.1 mg/dL (ref 8.9–10.3)
Chloride: 105 mmol/L (ref 98–111)
Creatinine, Ser: 1.61 mg/dL — ABNORMAL HIGH (ref 0.61–1.24)
GFR calc Af Amer: 49 mL/min — ABNORMAL LOW (ref >60)
GFR calc non Af Amer: 43 mL/min — ABNORMAL LOW (ref >60)
Glucose, Bld: 96 mg/dL (ref 70–99)
Potassium: 3.5 mmol/L (ref 3.5–5.1)
Sodium: 139 mmol/L (ref 135–145)

## 2019-07-10 LAB — CBC
HCT: 30.4 % — ABNORMAL LOW (ref 39.0–52.0)
Hemoglobin: 9.6 g/dL — ABNORMAL LOW (ref 13.0–17.0)
MCH: 28.2 pg (ref 26.0–34.0)
MCHC: 31.6 g/dL (ref 30.0–36.0)
MCV: 89.4 fL (ref 80.0–100.0)
Platelets: 283 10*3/uL (ref 150–400)
RBC: 3.4 MIL/uL — ABNORMAL LOW (ref 4.22–5.81)
RDW: 15.3 % (ref 11.5–15.5)
WBC: 8.4 10*3/uL (ref 4.0–10.5)
nRBC: 0 % (ref 0.0–0.2)

## 2019-07-14 LAB — BASIC METABOLIC PANEL
Anion gap: 11 (ref 5–15)
BUN: 20 mg/dL (ref 8–23)
CO2: 24 mmol/L (ref 22–32)
Calcium: 8.9 mg/dL (ref 8.9–10.3)
Chloride: 105 mmol/L (ref 98–111)
Creatinine, Ser: 1.51 mg/dL — ABNORMAL HIGH (ref 0.61–1.24)
GFR calc Af Amer: 53 mL/min — ABNORMAL LOW (ref >60)
GFR calc non Af Amer: 46 mL/min — ABNORMAL LOW (ref >60)
Glucose, Bld: 103 mg/dL — ABNORMAL HIGH (ref 70–99)
Potassium: 3.9 mmol/L (ref 3.5–5.1)
Sodium: 140 mmol/L (ref 135–145)

## 2019-07-14 LAB — CBC
HCT: 29.5 % — ABNORMAL LOW (ref 39.0–52.0)
Hemoglobin: 9.6 g/dL — ABNORMAL LOW (ref 13.0–17.0)
MCH: 28.8 pg (ref 26.0–34.0)
MCHC: 32.5 g/dL (ref 30.0–36.0)
MCV: 88.6 fL (ref 80.0–100.0)
Platelets: 361 10*3/uL (ref 150–400)
RBC: 3.33 MIL/uL — ABNORMAL LOW (ref 4.22–5.81)
RDW: 15.4 % (ref 11.5–15.5)
WBC: 8.7 10*3/uL (ref 4.0–10.5)
nRBC: 0 % (ref 0.0–0.2)

## 2019-07-15 ENCOUNTER — Other Ambulatory Visit (HOSPITAL_COMMUNITY): Payer: Medicare PPO

## 2019-07-15 MED ORDER — GENERIC EXTERNAL MEDICATION
Status: DC
Start: ? — End: 2019-07-15

## 2019-07-17 LAB — BASIC METABOLIC PANEL
Anion gap: 13 (ref 5–15)
BUN: 23 mg/dL (ref 8–23)
CO2: 22 mmol/L (ref 22–32)
Calcium: 9 mg/dL (ref 8.9–10.3)
Chloride: 105 mmol/L (ref 98–111)
Creatinine, Ser: 1.33 mg/dL — ABNORMAL HIGH (ref 0.61–1.24)
GFR calc Af Amer: >60 mL/min (ref >60)
GFR calc non Af Amer: 54 mL/min — ABNORMAL LOW (ref >60)
Glucose, Bld: 98 mg/dL (ref 70–99)
Potassium: 3.9 mmol/L (ref 3.5–5.1)
Sodium: 140 mmol/L (ref 135–145)

## 2019-07-26 LAB — BASIC METABOLIC PANEL
Anion gap: 9 (ref 5–15)
BUN: 32 mg/dL — ABNORMAL HIGH (ref 8–23)
CO2: 26 mmol/L (ref 22–32)
Calcium: 9.2 mg/dL (ref 8.9–10.3)
Chloride: 105 mmol/L (ref 98–111)
Creatinine, Ser: 1.45 mg/dL — ABNORMAL HIGH (ref 0.61–1.24)
GFR calc Af Amer: 56 mL/min — ABNORMAL LOW (ref >60)
GFR calc non Af Amer: 48 mL/min — ABNORMAL LOW (ref >60)
Glucose, Bld: 111 mg/dL — ABNORMAL HIGH (ref 70–99)
Potassium: 4.4 mmol/L (ref 3.5–5.1)
Sodium: 140 mmol/L (ref 135–145)

## 2019-07-26 LAB — CBC
HCT: 31.9 % — ABNORMAL LOW (ref 39.0–52.0)
Hemoglobin: 10.2 g/dL — ABNORMAL LOW (ref 13.0–17.0)
MCH: 28.8 pg (ref 26.0–34.0)
MCHC: 32 g/dL (ref 30.0–36.0)
MCV: 90.1 fL (ref 80.0–100.0)
Platelets: 381 10*3/uL (ref 150–400)
RBC: 3.54 MIL/uL — ABNORMAL LOW (ref 4.22–5.81)
RDW: 16 % — ABNORMAL HIGH (ref 11.5–15.5)
WBC: 8.1 10*3/uL (ref 4.0–10.5)
nRBC: 0 % (ref 0.0–0.2)

## 2019-08-01 LAB — CBC
HCT: 34.1 % — ABNORMAL LOW (ref 39.0–52.0)
Hemoglobin: 10.6 g/dL — ABNORMAL LOW (ref 13.0–17.0)
MCH: 28.3 pg (ref 26.0–34.0)
MCHC: 31.1 g/dL (ref 30.0–36.0)
MCV: 90.9 fL (ref 80.0–100.0)
Platelets: 379 10*3/uL (ref 150–400)
RBC: 3.75 MIL/uL — ABNORMAL LOW (ref 4.22–5.81)
RDW: 16 % — ABNORMAL HIGH (ref 11.5–15.5)
WBC: 7.1 10*3/uL (ref 4.0–10.5)
nRBC: 0 % (ref 0.0–0.2)

## 2019-08-01 LAB — BASIC METABOLIC PANEL
Anion gap: 14 (ref 5–15)
BUN: 25 mg/dL — ABNORMAL HIGH (ref 8–23)
CO2: 25 mmol/L (ref 22–32)
Calcium: 9.3 mg/dL (ref 8.9–10.3)
Chloride: 102 mmol/L (ref 98–111)
Creatinine, Ser: 1.31 mg/dL — ABNORMAL HIGH (ref 0.61–1.24)
GFR calc Af Amer: >60 mL/min (ref >60)
GFR calc non Af Amer: 55 mL/min — ABNORMAL LOW (ref >60)
Glucose, Bld: 135 mg/dL — ABNORMAL HIGH (ref 70–99)
Potassium: 3.7 mmol/L (ref 3.5–5.1)
Sodium: 141 mmol/L (ref 135–145)

## 2019-08-04 LAB — SARS CORONAVIRUS 2 (TAT 6-24 HRS): SARS Coronavirus 2: NEGATIVE

## 2020-06-30 IMAGING — DX DG CHEST 1V PORT
1 series · 1 of 1 positions shown · non-contrast
Comparison: None.

CLINICAL DATA: Evaluate for pneumonia.

EXAM:
PORTABLE CHEST 1 VIEW

[chest]
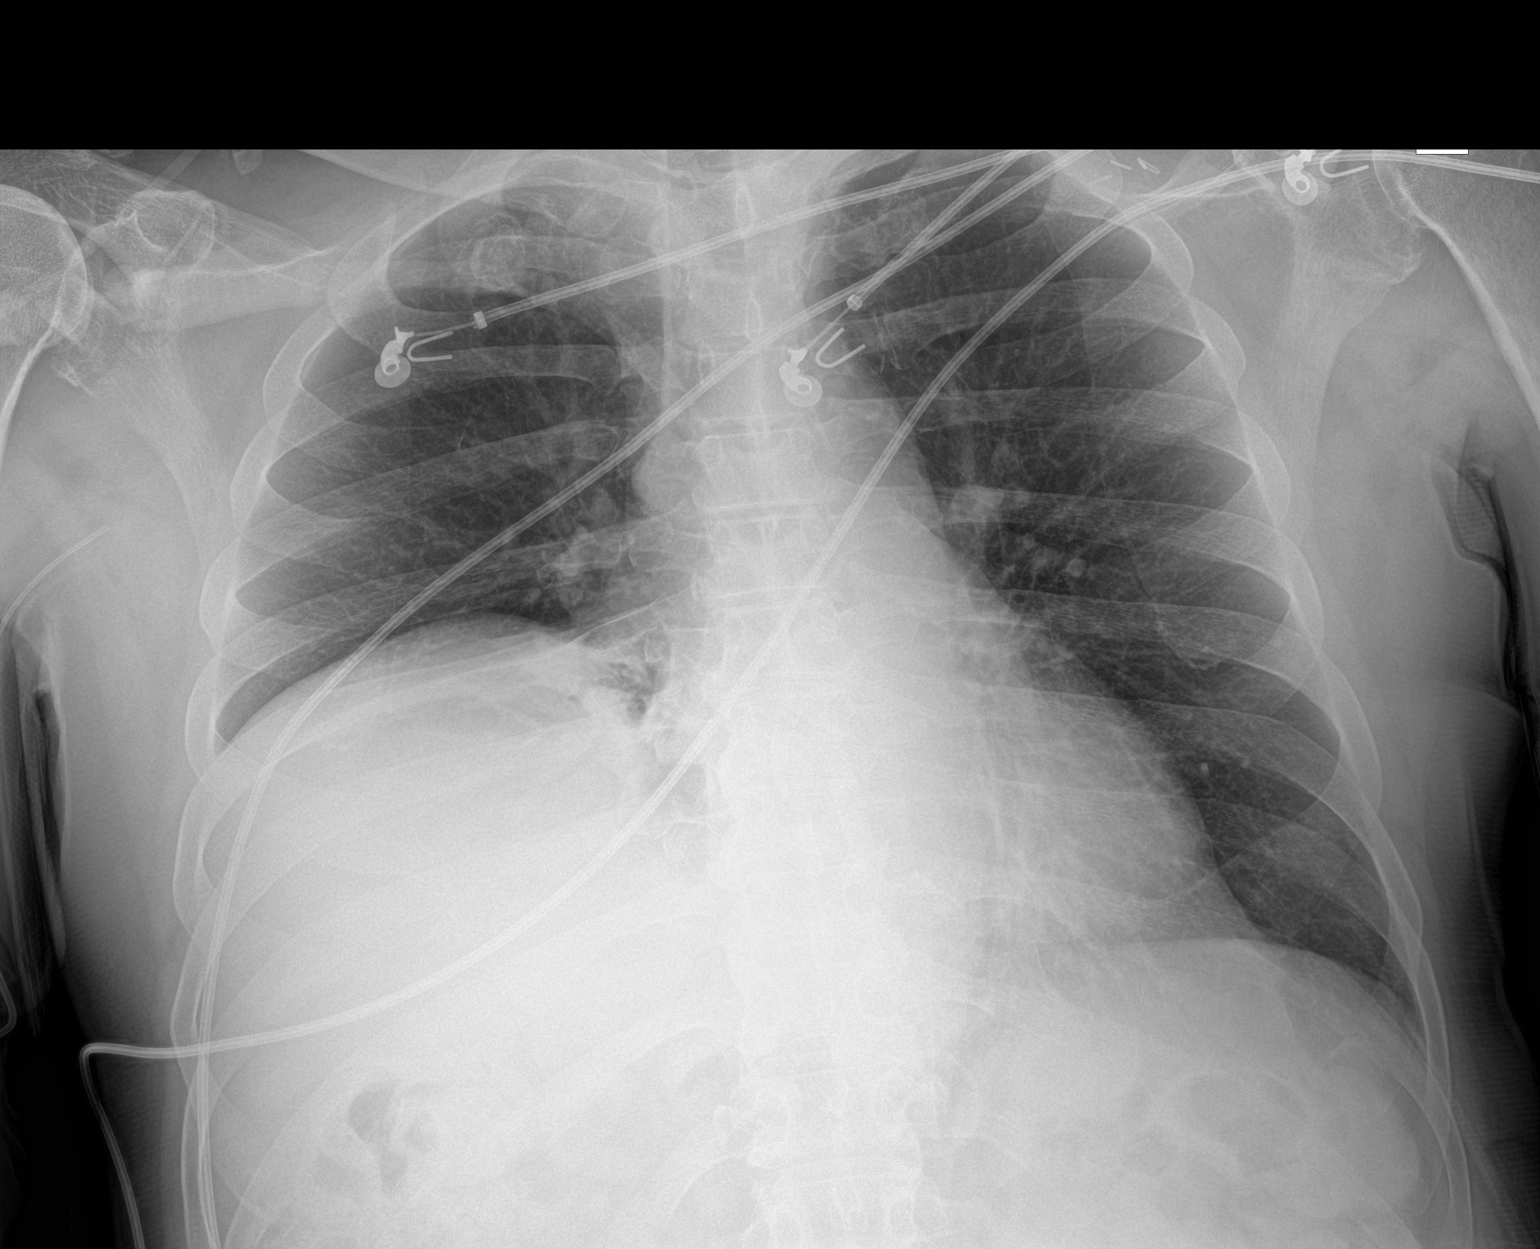

[1 of 1 positions shown; findings below may reference images not displayed]

FINDINGS: Heart size and mediastinal contours are within normal limits. Lungs
are clear. RIGHT upper lung is clear. Elevation of the RIGHT
hemidiaphragm limits characterization of the RIGHT lung base.
Osseous structures about the chest are unremarkable.
IMPRESSION: 1. Elevation of the RIGHT hemidiaphragm, limiting characterization
of the RIGHT lung base. Consider lateral chest x-ray to exclude
RIGHT lower lobe pneumonia.
2. No evidence of pneumonia within the LEFT lung or within the RIGHT
upper lung.

## 2020-07-06 IMAGING — CT CT TIBIA FIBULA *L* W/ CM
3 of 6 series · 8 of 36 positions shown, 9 images · IV contrast (agent unspecified)
Comparison: None.

CONTRAST:  80mL OMNIPAQUE IOHEXOL 300 MG/ML  SOLN

CLINICAL DATA: Fevers.

EXAM:
CT OF THE LOWER LEFT EXTREMITY WITH CONTRAST
TECHNIQUE: Multidetector CT imaging of the lower right extremity was performed
according to the standard protocol following intravenous contrast
administration.

[Series 5: lfov ext 3.0 b40s · axial · 0.67mm/px · z∈[+428,+668]mm · 2 of 173 slices shown, 3 images]
[im 53/173  soft-tissue]
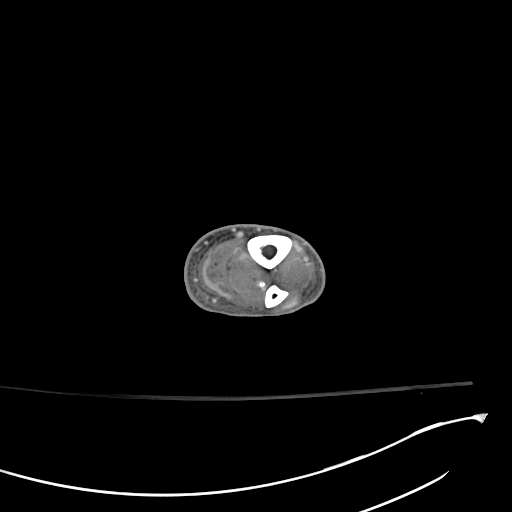
[im 53/173  bone]
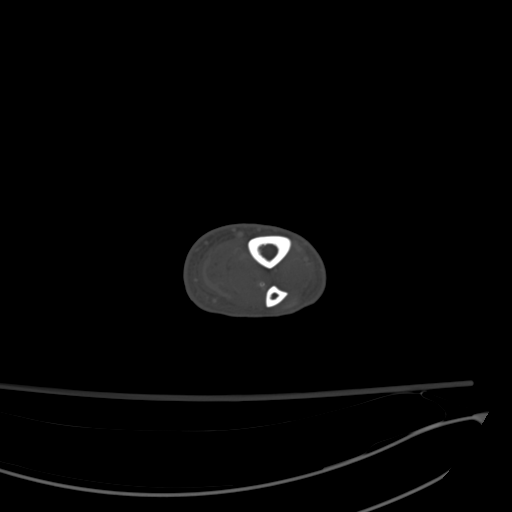
[im 133/173  bone]
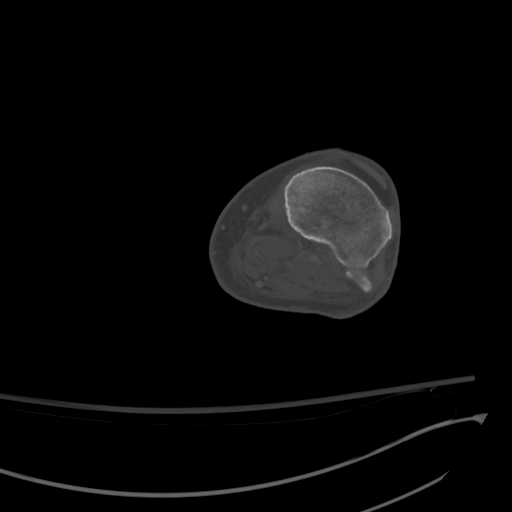

[Series 8: coronalsoft tissue · coronal · 0.49mm/px · 1 of 93 slices shown]
[im 47/93  bone]
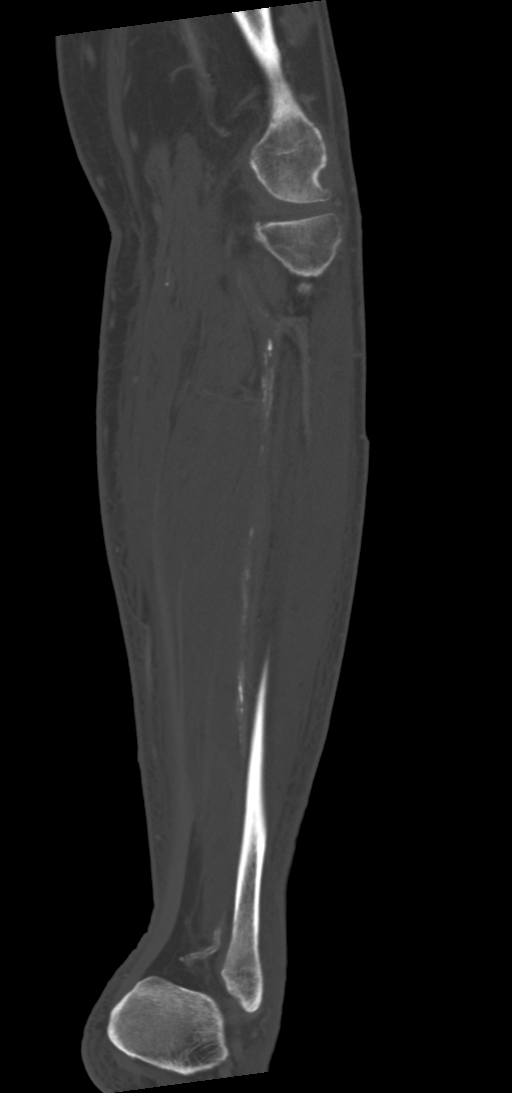

[Series 9: sagittalsoft tissue · sagittal · 0.36mm/px · 5 of 106 slices shown]
[im 36/106  bone]
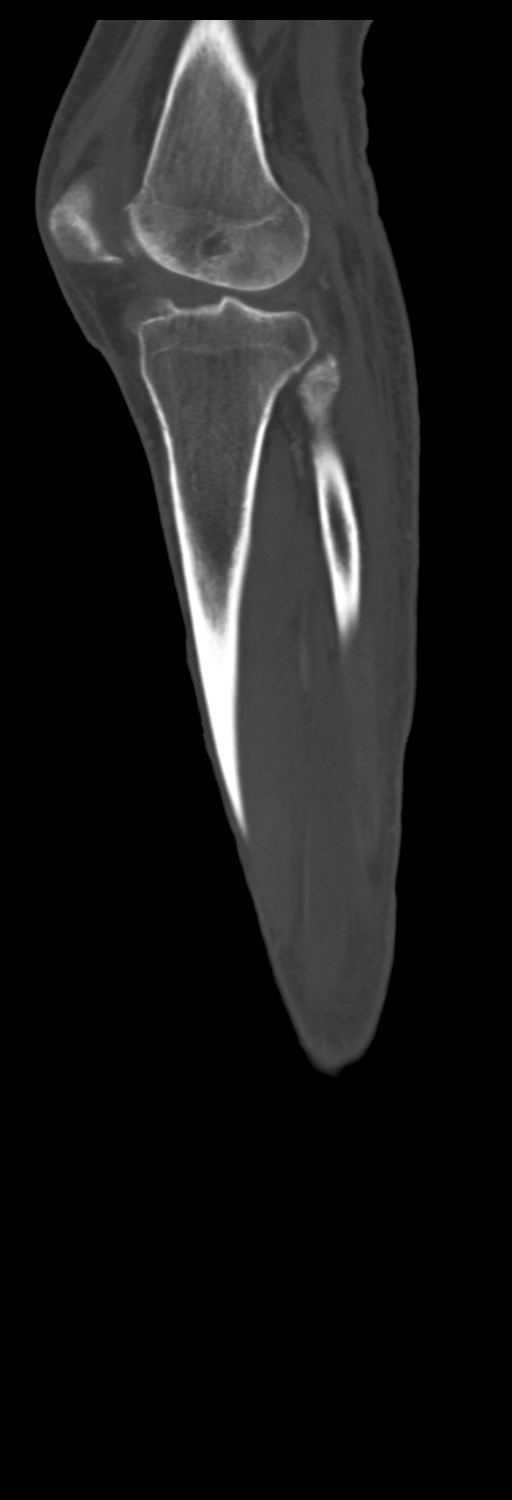
[im 44/106  bone]
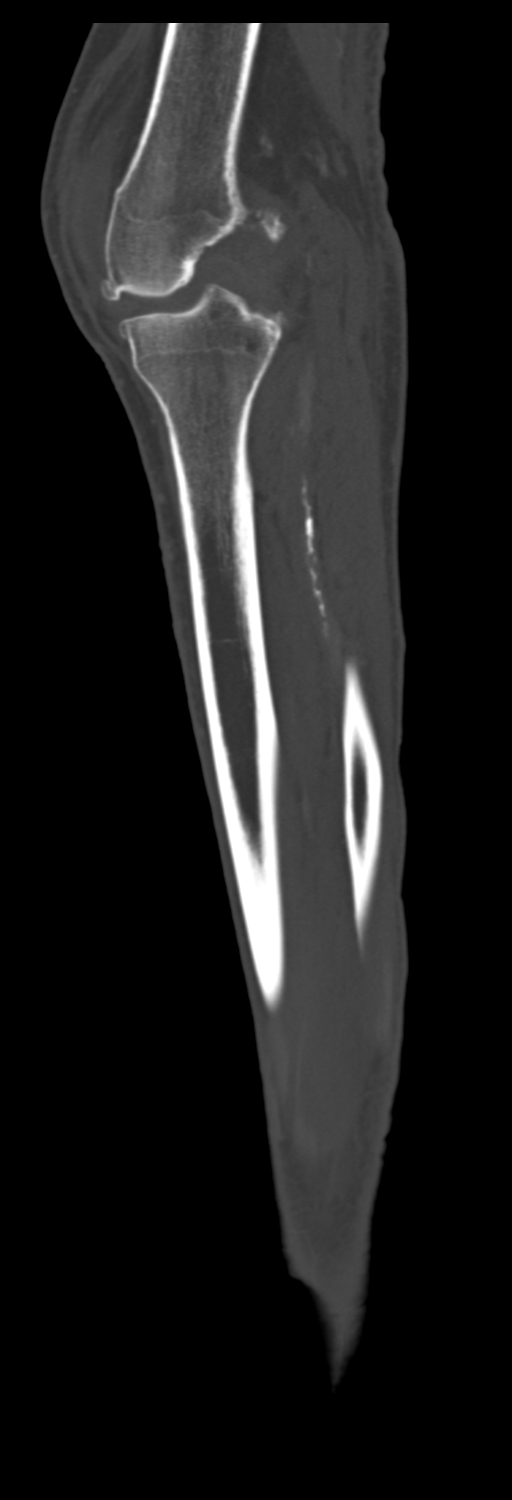
[im 53/106  bone]
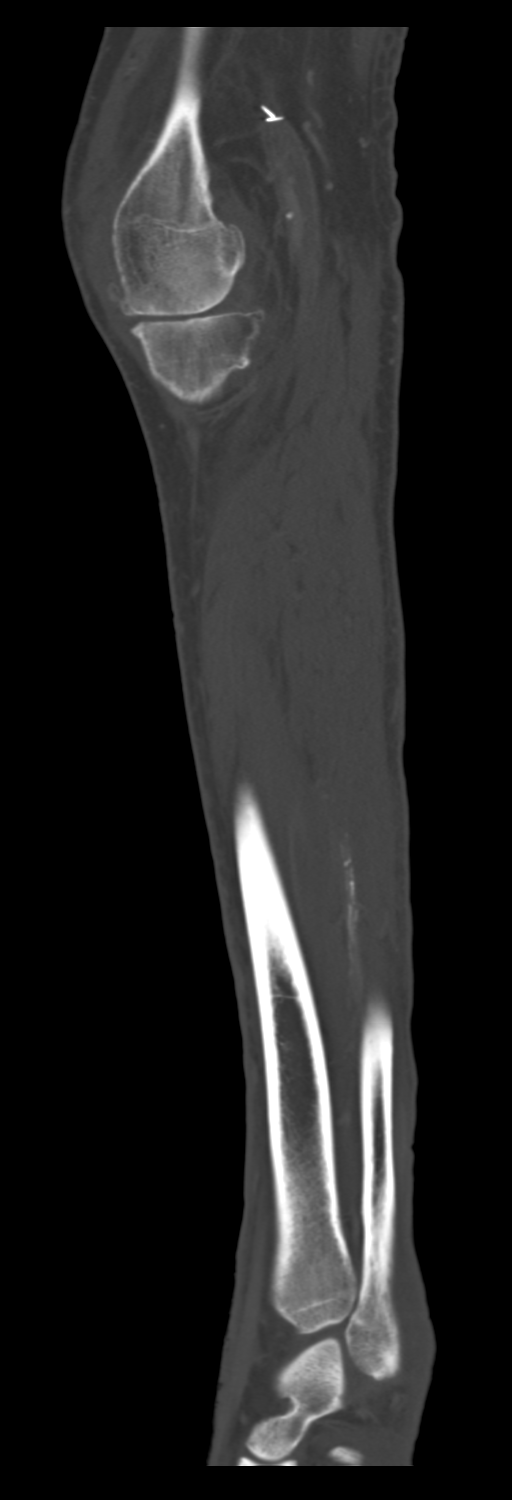
[im 62/106  bone]
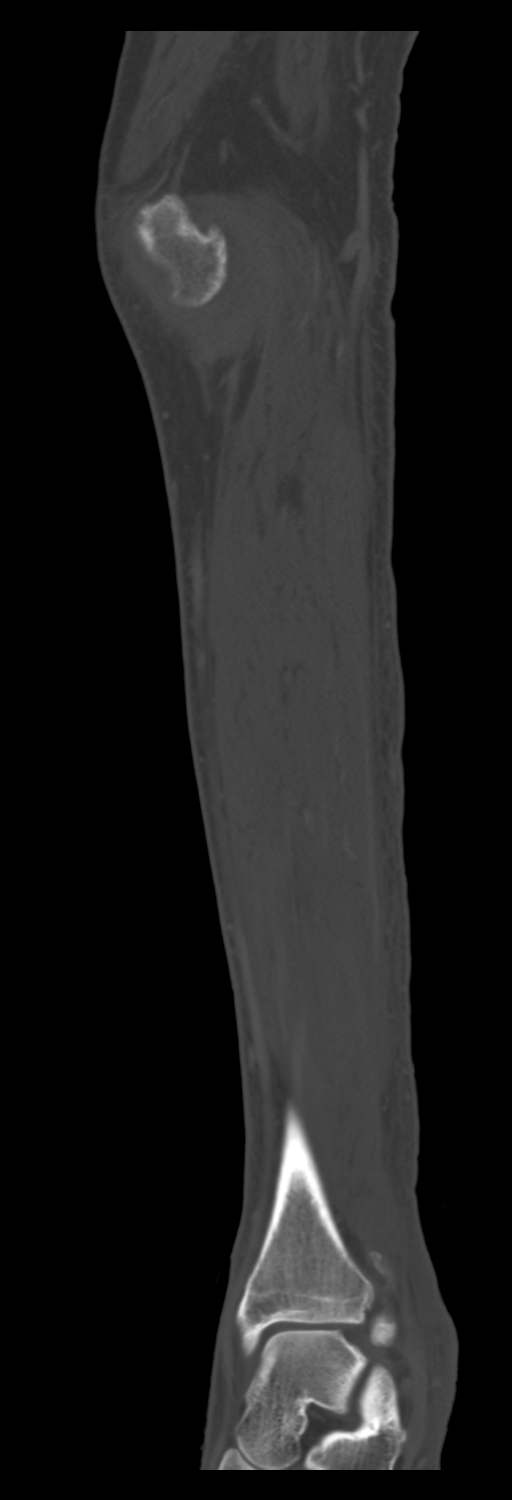
[im 71/106  bone]
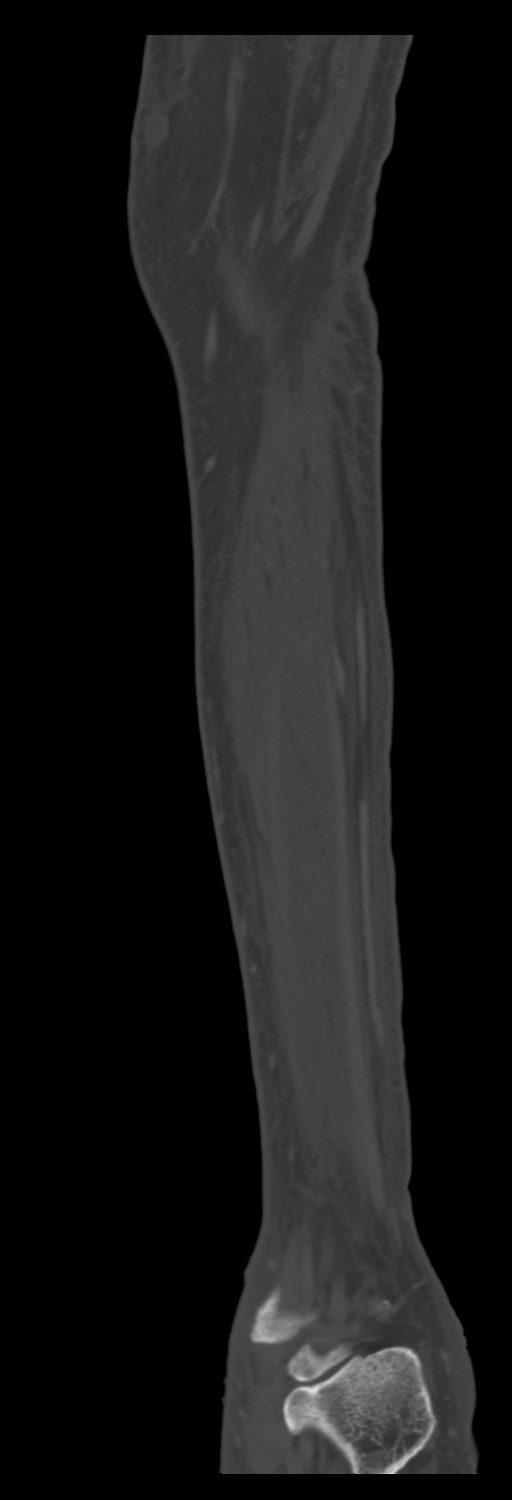

[8 of 36 positions shown; findings below may reference images not displayed]

FINDINGS: Bones/Joint/Cartilage

Generalized osteopenia. No fracture or dislocation. Normal
alignment. Moderate joint effusion.

Severe medial femorotibial compartment joint space narrowing with a
bone-on-bone appearance and marginal osteophytes. Mild lateral
femorotibial compartment joint space narrowing with marginal
osteophytes. Mild patellofemoral compartment joint space narrowing
with marginal osteophytes.

No periosteal reaction or bone destruction.

Ligaments

Ligaments are suboptimally evaluated by CT.

Muscles and Tendons
Muscles are normal. No muscle atrophy. No intramuscular fluid
collection or hematoma. Flexor, extensor, peroneal and Achilles
tendons are grossly intact. Quadriceps tendon and patellar tendon
are grossly intact.

Soft tissue
No fluid collection or hematoma. No soft tissue mass. Soft tissue
edema of the left lower leg and ankle which may be reactive
secondary to fluid overload versus cellulitis. No drainable fluid
collection to suggest an abscess.
IMPRESSION: 1. Soft tissue edema of the left lower leg and ankle which may be
reactive secondary to fluid overload versus cellulitis. No drainable
fluid collection to suggest an abscess.
2. Tricompartmental osteoarthritis of left knee most severe in the
medial femorotibial compartment.
